# Patient Record
Sex: Female | Born: 1990 | Race: White | Hispanic: No | Marital: Single | Smoking: Never smoker
Health system: Southern US, Community
[De-identification: ages and names within clinical notes are randomized; demographics above are authoritative.]

## PROBLEM LIST (undated history)

## (undated) DIAGNOSIS — J45909 Unspecified asthma, uncomplicated: Secondary | ICD-10-CM

## (undated) DIAGNOSIS — E282 Polycystic ovarian syndrome: Secondary | ICD-10-CM

## (undated) HISTORY — PX: TONSILLECTOMY: SUR1361

---

## 2020-07-28 DIAGNOSIS — Z3480 Encounter for supervision of other normal pregnancy, unspecified trimester: Secondary | ICD-10-CM | POA: Insufficient documentation

## 2020-07-28 LAB — OB RESULTS CONSOLE HEPATITIS B SURFACE ANTIGEN: Hepatitis B Surface Ag: NEGATIVE

## 2020-07-28 LAB — OB RESULTS CONSOLE RPR: RPR: NONREACTIVE

## 2020-07-28 LAB — OB RESULTS CONSOLE RUBELLA ANTIBODY, IGM: Rubella: NON-IMMUNE/NOT IMMUNE

## 2020-07-28 LAB — OB RESULTS CONSOLE VARICELLA ZOSTER ANTIBODY, IGG: Varicella: IMMUNE

## 2020-07-28 LAB — OB RESULTS CONSOLE HIV ANTIBODY (ROUTINE TESTING): HIV: NONREACTIVE

## 2020-10-03 ENCOUNTER — Observation Stay
Admission: EM | Admit: 2020-10-03 | Discharge: 2020-10-03 | Disposition: A | Discharge status: Home / Self Care | Payer: BC Managed Care – PPO | Attending: Certified Nurse Midwife | Admitting: Certified Nurse Midwife

## 2020-10-03 ENCOUNTER — Other Ambulatory Visit: Payer: Self-pay

## 2020-10-03 ENCOUNTER — Encounter: Payer: Self-pay | Admitting: Obstetrics and Gynecology

## 2020-10-03 ENCOUNTER — Observation Stay: Payer: BC Managed Care – PPO

## 2020-10-03 DIAGNOSIS — O99212 Obesity complicating pregnancy, second trimester: Secondary | ICD-10-CM | POA: Diagnosis not present

## 2020-10-03 DIAGNOSIS — Z3A20 20 weeks gestation of pregnancy: Secondary | ICD-10-CM | POA: Diagnosis not present

## 2020-10-03 DIAGNOSIS — O26852 Spotting complicating pregnancy, second trimester: Principal | ICD-10-CM | POA: Insufficient documentation

## 2020-10-03 DIAGNOSIS — Z91040 Latex allergy status: Secondary | ICD-10-CM | POA: Insufficient documentation

## 2020-10-03 DIAGNOSIS — E669 Obesity, unspecified: Secondary | ICD-10-CM | POA: Insufficient documentation

## 2020-10-03 DIAGNOSIS — O4690 Antepartum hemorrhage, unspecified, unspecified trimester: Secondary | ICD-10-CM | POA: Diagnosis present

## 2020-10-03 HISTORY — DX: Polycystic ovarian syndrome: E28.2

## 2020-10-03 LAB — WET PREP, GENITAL
Clue Cells Wet Prep HPF POC: NONE SEEN
Sperm: NONE SEEN
Trich, Wet Prep: NONE SEEN
Yeast Wet Prep HPF POC: NONE SEEN

## 2020-10-03 MED ORDER — ACETAMINOPHEN 325 MG PO TABS: 650.0000 mg | ORAL_TABLET | ORAL | Status: DC | PRN

## 2020-10-03 NOTE — Discharge Summary (Signed)
Patient ID: Jacqueline French MRN: [id number] DOB/AGE: July 04, 1990 30 y.o.  Admit date: 10/03/2020 Discharge date: 10/03/2020  Admission Diagnoses: 30yo G2P0 at [redacted]w[redacted]d presents with cramping and pink bleeding with wiping starting around noon today, followed by several clots    Discharge Diagnoses: Spotting with reassuring u/s and labs and a resolution of bleeding  Factors complicating pregnancy: 1. Obesity - BMI 36 2. History of thyroid d/o 3. Anxiety d/o  4. Social concerns  5. Rubella non-immune  Prenatal Procedures: labs, ultrasound  Consults: None  Significant Diagnostic Studies:  Results for orders placed or performed during the hospital encounter of 10/03/20 (from the past 168 hour(s))  Wet prep, genital   Collection Time: 10/03/20  7:20 PM  Result Value Ref Range   Yeast Wet Prep HPF POC NONE SEEN NONE SEEN   Trich, Wet Prep NONE SEEN NONE SEEN   Clue Cells Wet Prep HPF POC NONE SEEN NONE SEEN   WBC, Wet Prep HPF POC FEW (A) NONE SEEN   Sperm NONE SEEN     Treatments: none  Hospital Course:  This is a 30 y.o. G2P0010 with IUP at [redacted]w[redacted]d seen for vaginal bleeding and cramping.    Wet prep - neg  U/s -  EXAM: LIMITED OBSTETRIC ULTRASOUND COMPARISON:  None. FINDINGS: Number of Fetuses: 1 Heart Rate:  141 bpm Movement: Yes Presentation: Variable Placental Location: Fundal posterior Previa: No Amniotic Fluid (Subjective):  Within normal limits. BPD: 4.4 cm 19 w  2 d MATERNAL FINDINGS: Cervix: There is a small amount of fluid within the internal cervical os. There is no funneling or bulging of membranes. Cervical length of 3.2 cm. Uterus/Adnexae: No abnormality visualized. IMPRESSION: 1. Negative for previa or retroplacental hematoma. 2. Small amount of fluid within the internal cervical os without apparent funneling or bulging of membranes, however short interval sonographic follow-up/reassessment is recommended  She was observed, fetal heart rate noted on doppler.   She was deemed stable for discharge to home with outpatient follow up.  Discharge Physical Exam:  BP (!) 119/55   Pulse 76   Temp 99 F (37.2 C) (Oral)   Resp 16   Ht 5\' 7"  (1.702 m)   Wt 106.6 kg   BMI 36.81 kg/m   General: NAD CV: RRR Pulm: CTABL, nl effort ABD: s/nd/nt, gravid DVT Evaluation: LE non-ttp, no evidence of DVT on exam.      Discharge Condition: Stable  Disposition: Discharge disposition: 01-Home or Self Care       Allergies as of 10/03/2020       Reactions   Sulfa Antibiotics Anaphylaxis   Latex Rash        Medication List     TAKE these medications    multivitamin-prenatal 27-0.8 MG Tabs tablet Take 1 tablet by mouth daily at 12 noon.         Signed11/06/2020, CNM 10/03/2020 8:47 PM

## 2020-10-03 NOTE — OB Triage Note (Signed)
Pt G2P0 at [redacted]w[redacted]d with c/o bleeding that started around noon. Pt reports pink bleeding when she wipes and several 1cm clots in the toilet. +FM. Pt reports 2/10 cramping in her right abd. VSS.

## 2020-10-03 NOTE — OB Triage Note (Signed)
Pt discharged home in stable condition. RN provided discharge instructions to pt, including information follow up care/when to come back. Pt verbalized understanding and all questions answered at this time.

## 2021-01-24 LAB — OB RESULTS CONSOLE GC/CHLAMYDIA
Chlamydia: NEGATIVE
Gonorrhea: NEGATIVE

## 2021-01-24 LAB — OB RESULTS CONSOLE GBS: GBS: POSITIVE

## 2021-01-24 NOTE — H&P (Signed)
Jacqueline French is a 30 y.o. female presenting for ECV Pt seen in office 01/24/21 by me and was counseled for ECV for breech presentation . Calculator 43%  OB History     Gravida  2   Para      Term      Preterm      AB  1   Living         SAB  1   IAB      Ectopic      Multiple      Live Births             Past Medical History:  Diagnosis Date   PCOS (polycystic ovarian syndrome)    Past Surgical History:  Procedure Laterality Date   TONSILLECTOMY     Family History: family history is not on file. Social History:  reports that she has never smoked. She has never used smokeless tobacco. She reports that she does not currently use alcohol. She reports that she does not use drugs.       Review of Systems Review of Systems: A full review of systems was performed and negative except as noted in the HPI.   Eyes: no vision change  Ears: left ear pain  Oropharynx: no sore throat  Pulmonary . No shortness of breath , no hemoptysis Cardiovascular: no chest pain , no irregular heart beat  Gastrointestinal:no blood in stool . No diarrhea, no constipation Uro gynecologic: no dysuria , no pelvic pain Neurologic : no seizure , no migraines    Musculoskeletal: no muscular weakness  History   Blood pressure (!) 119/55, pulse 76, temperature 99 F (37.2 C), temperature source Oral, resp. rate 16, height 5\' 7"  (1.702 m), weight 106.6 kg. Exam 118/74 weight 280 BMI 44 Physical Exam   Lungs CTA   CV rrr  Abd :  Prenatal labs: ABO, Rh:  O+  Assessment/Plan: Breech / back left  Counseled regarding the role of ECV . Risks of emergent c/s . Risks of unstable lie . Offered Primary LTCS . Pt opts for the former . Scheduled 12/26/20 at 1200   12/28/20 01/24/2021, 3:03 PM

## 2021-01-25 ENCOUNTER — Other Ambulatory Visit: Payer: Self-pay

## 2021-01-25 ENCOUNTER — Encounter: Payer: Self-pay | Admitting: Obstetrics and Gynecology

## 2021-01-25 ENCOUNTER — Observation Stay
Admission: EM | Admit: 2021-01-25 | Discharge: 2021-01-25 | Disposition: A | Discharge status: Home / Self Care | Payer: BC Managed Care – PPO | Attending: Obstetrics and Gynecology | Admitting: Obstetrics and Gynecology

## 2021-01-25 DIAGNOSIS — O321XX0 Maternal care for breech presentation, not applicable or unspecified: Principal | ICD-10-CM | POA: Insufficient documentation

## 2021-01-25 DIAGNOSIS — Z01818 Encounter for other preprocedural examination: Secondary | ICD-10-CM

## 2021-01-25 DIAGNOSIS — O320XX0 Maternal care for unstable lie, not applicable or unspecified: Secondary | ICD-10-CM | POA: Diagnosis present

## 2021-01-25 DIAGNOSIS — Z20822 Contact with and (suspected) exposure to covid-19: Secondary | ICD-10-CM | POA: Insufficient documentation

## 2021-01-25 HISTORY — DX: Unspecified asthma, uncomplicated: J45.909

## 2021-01-25 LAB — CBC
HCT: 41.3 % (ref 36.0–46.0)
Hemoglobin: 13.9 g/dL (ref 12.0–15.0)
MCH: 28.2 pg (ref 26.0–34.0)
MCHC: 33.7 g/dL (ref 30.0–36.0)
MCV: 83.8 fL (ref 80.0–100.0)
Platelets: 162 10*3/uL (ref 150–400)
RBC: 4.93 MIL/uL (ref 3.87–5.11)
RDW: 13.6 % (ref 11.5–15.5)
WBC: 10.1 10*3/uL (ref 4.0–10.5)
nRBC: 0 % (ref 0.0–0.2)

## 2021-01-25 LAB — RESP PANEL BY RT-PCR (FLU A&B, COVID) ARPGX2
Influenza A by PCR: NEGATIVE
Influenza B by PCR: NEGATIVE
SARS Coronavirus 2 by RT PCR: NEGATIVE

## 2021-01-25 MED ORDER — TERBUTALINE SULFATE 1 MG/ML IJ SOLN
0.2500 mg | Freq: Once | INTRAMUSCULAR | Status: DC
  Filled 2021-01-25: qty 1

## 2021-01-25 MED ORDER — TERBUTALINE SULFATE 1 MG/ML IJ SOLN
0.2500 mg | Freq: Once | INTRAMUSCULAR | Status: AC
  Administered 2021-01-25: 13:00:00 0.25 mg via SUBCUTANEOUS

## 2021-01-25 MED ORDER — LACTATED RINGERS IV SOLN: INTRAVENOUS | Status: DC

## 2021-01-25 NOTE — Progress Notes (Signed)
Patient ID: Jacqueline French, female   DOB: 02-04-1990, 30 y.o.   MRN: [id number] Pt here for ECV  NPO . IV placed   U/s confirms Breech spine right sided . Consent signed . Anesthesiology has been notified 1 dose Sq terbutaline given

## 2021-01-25 NOTE — OB Triage Note (Signed)
Discharge instructions reviewed and pt verbalized understanding. Follow up care reviewed and red flag labor precautions explained. Pt stable at the time of discharge home with mother.

## 2021-01-25 NOTE — Progress Notes (Signed)
Patient ID: Jacqueline French, female   DOB: [birth year], 30 y.o.   MRN: [id number] ECV attempted with CNM [place] . Unsuccessful after several attempts  EFM resumed fetal heart rate 120 Continue to monitor .  Set up for LTCS 02/09/21

## 2021-01-25 NOTE — Discharge Summary (Signed)
Unsucessful ECV   Reassuring NST before and after . No ctx post .  A; breech presentation , unsuccessful trial of ecv P; scheduled primary LTCs 02/09/21 Precautions until then

## 2021-01-25 NOTE — Op Note (Signed)
Jacqueline French, Jacqueline French MEDICAL RECORD NO: [id number] ACCOUNT NO: [id number] DATE OF BIRTH: 13-Mar-1990 FACILITY: [facility] LOCATION: [facility] PHYSICIAN: Suzy Bouchard, MD  Operative Report   DATE OF PROCEDURE: 01/25/2021  PREPROCEDURE DIAGNOSES: 1.  37+1 weeks estimated gestational age. 2.  Breech presentation.  POSTPROCEDURE DIAGNOSES: 1.  37+1 weeks. 2.  Breech presentation.  PROCEDURE:  Trial of external cephalic version.  SURGEON:  Suzy Bouchard, MD  FIRST ASSISTANT:  Haroldine Laws, certified nurse midwife.  INDICATIONS:  A 29 year old gravida 1, para 0, patient was noted to have her infant in the breech presentation the day before the procedure. The patient was counseled regarding the option for external cephalic version, which she has opted for.  DESCRIPTION OF PROCEDURE:  The patient was brought into the labor and delivery suite.  IV was placed.  Nonstress test was performed, which showed a reactive fetal monitoring.  The patient received 0.25 mg of subcutaneous terbutaline for uterine  relaxation.  An ultrasound revealed the fetal head to be in the right upper quadrant with the spine on the right side and infant was in the breech presentation.  Dr. Feliberto Gottron and Haroldine Laws then attempted the external cephalic version rolling the  infant in a clockwise position.  Several attempts were tried and ultrasound guidance showed that the head did move to the left upper quadrant, but did not proceed into the lower uterine segment.  The patient tolerated the procedure well.  Nonstress test  was then performed for 1 hour after with reassuring reactive nonstress test.  The patient tolerated the procedure well.  There was no bleeding, no contractions after the procedure.   VAI D: 01/25/2021 5:09:18 pm T: 01/25/2021 10:21:00 pm  JOB: [id number]/ [id number]

## 2021-01-25 NOTE — Progress Notes (Signed)
Pt arrived to L&D for scheduled external cephalic version. Pt stable at the time of arrival and VSS. Pt denies ctx, LOF, VB and confirms positive fetal movement. Pts mother at bedside for support. Schermerhorn MD made aware of pts arrival.

## 2021-01-29 NOTE — Discharge Summary (Signed)
Unsucessful ECV   Reassuring NST before and after . No ctx post .  A; breech presentation , unsuccessful trial of ecv P; scheduled primary LTCs 02/09/21 Precautions until then
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
## 2021-02-05 ENCOUNTER — Other Ambulatory Visit: Payer: BC Managed Care – PPO

## 2021-02-06 ENCOUNTER — Other Ambulatory Visit: Payer: Self-pay

## 2021-02-06 ENCOUNTER — Other Ambulatory Visit
Admission: RE | Admit: 2021-02-06 | Discharge: 2021-02-06 | Disposition: A | Discharge status: Home / Self Care | Payer: BC Managed Care – PPO | Source: Ambulatory Visit | Attending: Obstetrics and Gynecology | Admitting: Obstetrics and Gynecology

## 2021-02-06 DIAGNOSIS — Z20822 Contact with and (suspected) exposure to covid-19: Secondary | ICD-10-CM | POA: Insufficient documentation

## 2021-02-06 DIAGNOSIS — O99210 Obesity complicating pregnancy, unspecified trimester: Secondary | ICD-10-CM | POA: Insufficient documentation

## 2021-02-06 DIAGNOSIS — O320XX0 Maternal care for unstable lie, not applicable or unspecified: Secondary | ICD-10-CM

## 2021-02-06 DIAGNOSIS — Z01812 Encounter for preprocedural laboratory examination: Secondary | ICD-10-CM | POA: Insufficient documentation

## 2021-02-06 LAB — SARS CORONAVIRUS 2 (TAT 6-24 HRS): SARS Coronavirus 2: NEGATIVE
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
## 2021-02-07 ENCOUNTER — Inpatient Hospital Stay
Admission: EM | Admit: 2021-02-07 | Discharge: 2021-02-10 | DRG: 787 | Disposition: A | Discharge status: Home / Self Care | Payer: BC Managed Care – PPO | Attending: Obstetrics and Gynecology | Admitting: Obstetrics and Gynecology

## 2021-02-07 ENCOUNTER — Inpatient Hospital Stay: Payer: BC Managed Care – PPO | Admitting: Anesthesiology

## 2021-02-07 ENCOUNTER — Other Ambulatory Visit: Payer: Self-pay

## 2021-02-07 ENCOUNTER — Encounter: Payer: Self-pay | Admitting: Obstetrics and Gynecology

## 2021-02-07 ENCOUNTER — Other Ambulatory Visit: Payer: BC Managed Care – PPO

## 2021-02-07 DIAGNOSIS — Z9889 Other specified postprocedural states: Secondary | ICD-10-CM

## 2021-02-07 DIAGNOSIS — O99214 Obesity complicating childbirth: Secondary | ICD-10-CM | POA: Diagnosis present

## 2021-02-07 DIAGNOSIS — J45909 Unspecified asthma, uncomplicated: Secondary | ICD-10-CM | POA: Diagnosis present

## 2021-02-07 DIAGNOSIS — O99824 Streptococcus B carrier state complicating childbirth: Secondary | ICD-10-CM | POA: Diagnosis present

## 2021-02-07 DIAGNOSIS — O9081 Anemia of the puerperium: Secondary | ICD-10-CM | POA: Diagnosis not present

## 2021-02-07 DIAGNOSIS — O9952 Diseases of the respiratory system complicating childbirth: Secondary | ICD-10-CM | POA: Diagnosis present

## 2021-02-07 DIAGNOSIS — O320XX0 Maternal care for unstable lie, not applicable or unspecified: Principal | ICD-10-CM | POA: Diagnosis present

## 2021-02-07 DIAGNOSIS — D62 Acute posthemorrhagic anemia: Secondary | ICD-10-CM | POA: Diagnosis not present

## 2021-02-07 DIAGNOSIS — Z3A39 39 weeks gestation of pregnancy: Secondary | ICD-10-CM | POA: Diagnosis not present

## 2021-02-07 DIAGNOSIS — Z20822 Contact with and (suspected) exposure to covid-19: Secondary | ICD-10-CM | POA: Diagnosis present

## 2021-02-07 LAB — CBC
HCT: 37.1 % (ref 36.0–46.0)
Hemoglobin: 12.3 g/dL (ref 12.0–15.0)
MCH: 27.5 pg (ref 26.0–34.0)
MCHC: 33.2 g/dL (ref 30.0–36.0)
MCV: 83 fL (ref 80.0–100.0)
Platelets: 192 10*3/uL (ref 150–400)
RBC: 4.47 MIL/uL (ref 3.87–5.11)
RDW: 13.9 % (ref 11.5–15.5)
WBC: 10 10*3/uL (ref 4.0–10.5)
nRBC: 0 % (ref 0.0–0.2)

## 2021-02-07 LAB — ABO/RH: ABO/RH(D): O POS

## 2021-02-07 LAB — TYPE AND SCREEN
ABO/RH(D): O POS
Antibody Screen: NEGATIVE

## 2021-02-07 LAB — RPR: RPR Ser Ql: NONREACTIVE

## 2021-02-07 MED ORDER — SODIUM CHLORIDE 0.9 % IV SOLN
5.0000 10*6.[IU] | Freq: Once | INTRAVENOUS | Status: AC
  Administered 2021-02-07: 5 10*6.[IU] via INTRAVENOUS
  Filled 2021-02-07: qty 5

## 2021-02-07 MED ORDER — LACTATED RINGERS IV SOLN
INTRAVENOUS | Status: DC
  Administered 2021-02-07 (×2): 1000 mL via INTRAVENOUS

## 2021-02-07 MED ORDER — SODIUM CHLORIDE 0.9 % IV SOLN
INTRAVENOUS | Status: DC | PRN
  Administered 2021-02-07: 10 mL via EPIDURAL

## 2021-02-07 MED ORDER — MISOPROSTOL 25 MCG QUARTER TABLET
25.0000 ug | ORAL_TABLET | ORAL | Status: DC | PRN
  Administered 2021-02-07 (×2): 25 ug via BUCCAL
  Filled 2021-02-07 (×2): qty 1

## 2021-02-07 MED ORDER — ACETAMINOPHEN 500 MG PO TABS: 1000.0000 mg | ORAL_TABLET | Freq: Four times a day (QID) | ORAL | Status: DC | PRN

## 2021-02-07 MED ORDER — SODIUM CHLORIDE 0.9% FLUSH: 3.0000 mL | INTRAVENOUS | Status: DC | PRN

## 2021-02-07 MED ORDER — FENTANYL-BUPIVACAINE-NACL 0.5-0.125-0.9 MG/250ML-% EP SOLN
12.0000 mL/h | EPIDURAL | Status: DC | PRN
  Administered 2021-02-07: 12 mL/h via EPIDURAL

## 2021-02-07 MED ORDER — DIPHENHYDRAMINE HCL 50 MG/ML IJ SOLN: 12.5000 mg | INTRAMUSCULAR | Status: DC | PRN

## 2021-02-07 MED ORDER — LIDOCAINE-EPINEPHRINE (PF) 1.5 %-1:200000 IJ SOLN
INTRAMUSCULAR | Status: DC | PRN
  Administered 2021-02-07: 3 mL via EPIDURAL

## 2021-02-07 MED ORDER — OXYTOCIN-SODIUM CHLORIDE 30-0.9 UT/500ML-% IV SOLN
2.5000 [IU]/h | INTRAVENOUS | Status: DC
  Administered 2021-02-08: 30 [IU] via INTRAVENOUS
  Filled 2021-02-07: qty 500

## 2021-02-07 MED ORDER — PHENYLEPHRINE 40 MCG/ML (10ML) SYRINGE FOR IV PUSH (FOR BLOOD PRESSURE SUPPORT): 80.0000 ug | PREFILLED_SYRINGE | INTRAVENOUS | Status: DC | PRN

## 2021-02-07 MED ORDER — CALCIUM CARBONATE ANTACID 500 MG PO CHEW: 400.0000 mg | CHEWABLE_TABLET | Freq: Three times a day (TID) | ORAL | Status: DC | PRN

## 2021-02-07 MED ORDER — LACTATED RINGERS IV SOLN
500.0000 mL | INTRAVENOUS | Status: DC | PRN
  Administered 2021-02-07: 500 mL via INTRAVENOUS

## 2021-02-07 MED ORDER — LACTATED RINGERS IV SOLN
500.0000 mL | Freq: Once | INTRAVENOUS | Status: AC
  Administered 2021-02-07: 500 mL via INTRAVENOUS

## 2021-02-07 MED ORDER — PENICILLIN G POT IN DEXTROSE 60000 UNIT/ML IV SOLN
3.0000 10*6.[IU] | INTRAVENOUS | Status: DC
  Administered 2021-02-08 (×2): 3 10*6.[IU] via INTRAVENOUS
  Filled 2021-02-07 (×3): qty 50

## 2021-02-07 MED ORDER — SODIUM CHLORIDE 0.9 % IV SOLN: 250.0000 mL | INTRAVENOUS | Status: DC | PRN

## 2021-02-07 MED ORDER — SOD CITRATE-CITRIC ACID 500-334 MG/5ML PO SOLN: 30.0000 mL | ORAL | Status: DC | PRN

## 2021-02-07 MED ORDER — BUTORPHANOL TARTRATE 1 MG/ML IJ SOLN: 1.0000 mg | INTRAMUSCULAR | Status: DC | PRN

## 2021-02-07 MED ORDER — FENTANYL-BUPIVACAINE-NACL 0.5-0.125-0.9 MG/250ML-% EP SOLN
EPIDURAL | Status: AC
  Filled 2021-02-07: qty 250

## 2021-02-07 MED ORDER — TERBUTALINE SULFATE 1 MG/ML IJ SOLN
0.2500 mg | Freq: Once | INTRAMUSCULAR | Status: AC | PRN
  Administered 2021-02-08: 0.25 mg via SUBCUTANEOUS
  Filled 2021-02-07: qty 1

## 2021-02-07 MED ORDER — OXYTOCIN-SODIUM CHLORIDE 30-0.9 UT/500ML-% IV SOLN
1.0000 m[IU]/min | INTRAVENOUS | Status: DC
  Administered 2021-02-07: 2 m[IU]/min via INTRAVENOUS
  Administered 2021-02-07: 4 m[IU]/min via INTRAVENOUS
  Filled 2021-02-07 (×2): qty 500

## 2021-02-07 MED ORDER — MISOPROSTOL 200 MCG PO TABS
ORAL_TABLET | ORAL | Status: AC
  Filled 2021-02-07: qty 4

## 2021-02-07 MED ORDER — LIDOCAINE HCL (PF) 1 % IJ SOLN
INTRAMUSCULAR | Status: DC | PRN
  Administered 2021-02-07: 3 mL via SUBCUTANEOUS

## 2021-02-07 MED ORDER — LIDOCAINE HCL (PF) 1 % IJ SOLN
30.0000 mL | INTRAMUSCULAR | Status: DC | PRN
  Filled 2021-02-07: qty 30

## 2021-02-07 MED ORDER — OXYTOCIN BOLUS FROM INFUSION
333.0000 mL | Freq: Once | INTRAVENOUS | Status: DC
  Administered 2021-02-08: 333 mL via INTRAVENOUS

## 2021-02-07 MED ORDER — EPHEDRINE 5 MG/ML INJ: 10.0000 mg | INTRAVENOUS | Status: DC | PRN

## 2021-02-07 MED ORDER — ONDANSETRON HCL 4 MG/2ML IJ SOLN
4.0000 mg | Freq: Four times a day (QID) | INTRAMUSCULAR | Status: DC | PRN
  Administered 2021-02-08: 4 mg via INTRAVENOUS
  Filled 2021-02-07: qty 2

## 2021-02-07 MED ORDER — SODIUM CHLORIDE 0.9% FLUSH: 3.0000 mL | Freq: Two times a day (BID) | INTRAVENOUS | Status: DC

## 2021-02-07 MED ORDER — MISOPROSTOL 25 MCG QUARTER TABLET
25.0000 ug | ORAL_TABLET | ORAL | Status: DC | PRN
  Administered 2021-02-07 (×2): 25 ug via VAGINAL
  Filled 2021-02-07 (×2): qty 1

## 2021-02-07 NOTE — Anesthesia Procedure Notes (Signed)
Epidural Patient location during procedure: OB Start time: 02/07/2021 11:19 PM End time: 02/07/2021 11:44 PM  Staffing Anesthesiologist: Corinda Gubler, MD Performed: anesthesiologist   Preanesthetic Checklist Completed: patient identified, IV checked, site marked, risks and benefits discussed, surgical consent, monitors and equipment checked, pre-op evaluation and timeout performed  Epidural Patient position: sitting Prep: ChloraPrep Patient monitoring: heart rate, continuous pulse ox and blood pressure Approach: midline Location: L3-L4 Injection technique: LOR saline  Needle:  Needle type: Tuohy  Needle gauge: 17 G Needle length: 9 cm Needle insertion depth: 6 cm Catheter type: closed end flexible Catheter size: 19 Gauge Catheter at skin depth: 11 cm Test dose: negative and 1.5% lidocaine with Epi 1:200 K  Assessment Sensory level: T10 Events: blood not aspirated, injection not painful, no injection resistance, no paresthesia and negative IV test  Additional Notes first attempt Pt. Evaluated and documentation done after procedure finished. Patient identified. Risks/Benefits/Options discussed with patient including but not limited to bleeding, infection, nerve damage, paralysis, failed block, incomplete pain control, headache, blood pressure changes, nausea, vomiting, reactions to medication both or allergic, itching and postpartum back pain. Confirmed with bedside nurse the patient's most recent platelet count. Confirmed with patient that they are not currently taking any anticoagulation, have any bleeding history or any family history of bleeding disorders. Patient expressed understanding and wished to proceed. All questions were answered. Sterile technique was used throughout the entire procedure. Please see nursing notes for vital signs. Test dose was given through epidural catheter and negative prior to continuing to dose epidural or start infusion. Warning signs of high block  given to the patient including shortness of breath, tingling/numbness in hands, complete motor block, or any concerning symptoms with instructions to call for help. Patient was given instructions on fall risk and not to get out of bed. All questions and concerns addressed with instructions to call with any issues or inadequate analgesia.     Patient tolerated the insertion well without immediate complications.  Reason for block: procedure for painReason for block:procedure for pain

## 2021-02-07 NOTE — Progress Notes (Signed)
Labor Progress Note  Jacqueline French is a 31 y.o. G2P0010 at [redacted]w[redacted]d by LMP admitted for induction of labor due to unstable lie.  Subjective: Having the breath through her UCs  Objective: BP 127/83 (BP Location: Left Arm)    Pulse 75    Temp 98.6 F (37 C) (Oral)    Resp 16    Ht 5\' 7"  (1.702 m)    Wt 130 kg    SpO2 96%    BMI 44.89 kg/m   Fetal Assessment: FHT:  FHR: 125 bpm, variability: moderate,  accelerations:  Present,  decelerations:  Present occasional decels   Category/reactivity:  Category I UC:   regular, q 2-4 min SVE:    Dilation: FT  Effacement: 50%  Station:  -3  Consistency: medium  Position: middle  Membrane status:Intact Amniotic color: n/a  Labs: Lab Results  Component Value Date   WBC 10.0 02/07/2021   HGB 12.3 02/07/2021   HCT 37.1 02/07/2021   MCV 83.0 02/07/2021   PLT 192 02/07/2021    Assessment / Plan: Induction of labor due to unstable lie 0109 Cytotec 25mg  Buccal and 25mg  Vaginal  0500 Cytotec 25mg  Buccal and 25mg  Vaginal  1057 Pitocin initiated  1925 Pitocin halved to 54mU 2120 Plan to do low dose pitocin through the night - 67mU  Labor:  Low dose pitocin for cervical ripening Preeclampsia:   127/83 Fetal Wellbeing:  Category I Pain Control:  Labor support without medications I/D:  GBS pos - will start abx with labor, Afebrile, Intact Anticipated MOD:  NSVD  , CNM 02/07/2021, 9:46 PM

## 2021-02-07 NOTE — Progress Notes (Signed)
Labor Progress Note  Jacqueline French is a 31 y.o. G2P0010 at [redacted]w[redacted]d by LMP admitted for induction of labor due to unstable lie.  Subjective: Pt is comfortable.  Reporting more cramping.  Objective: BP 118/65    Pulse 76    Temp 97.7 F (36.5 C) (Oral)    Resp 16    Ht 5\' 7"  (1.702 m)    Wt 130 kg    SpO2 96%    BMI 44.89 kg/m   Fetal Assessment: FHT:  FHR: 130 bpm, variability: moderate,  accelerations:  Present,  decelerations:  Present occasional decels  with resolution with interventions - position changes Category/reactivity:  Category I UC:   regular, q 3-4 min SVE:    Dilation: FT  Effacement: 50%  Station:  -3  Consistency: medium  Position: middle  Membrane status:Intact Amniotic color: n/a  Labs: Lab Results  Component Value Date   WBC 10.0 02/07/2021   HGB 12.3 02/07/2021   HCT 37.1 02/07/2021   MCV 83.0 02/07/2021   PLT 192 02/07/2021    Assessment / Plan: Induction of labor due to unstable lie 0109 Cytotec 25mg  Buccal and 25mg  Vaginal  0500 Cytotec 25mg  Buccal and 25mg  Vaginal  1057 Pitocin initiated - currently at 60mU  Labor: Progressing on Pitocin, will continue to increase then AROM Preeclampsia:   118/65 Fetal Wellbeing:  Category I Pain Control:  Labor support without medications I/D:  GBS pos - will start abx with labor, Afebrile, Intact Anticipated MOD:  NSVD  , CNM 02/07/2021, 3:14 PM
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
## 2021-02-07 NOTE — Anesthesia Preprocedure Evaluation (Signed)
Anesthesia Evaluation  Patient identified by MRN, date of birth, ID band Patient awake    Reviewed: Allergy & Precautions, NPO status , Patient's Chart, lab work & pertinent test results  History of Anesthesia Complications Negative for: history of anesthetic complications  Airway Mallampati: III  TM Distance: >3 FB Neck ROM: Full    Dental no notable dental hx. (+) Teeth Intact   Pulmonary asthma , neg sleep apnea, neg COPD, Patient abstained from smoking.Not current smoker,  Well controlled asthma, no routine inhaler use, no hospitalizations since 31 years old.   Pulmonary exam normal breath sounds clear to auscultation       Cardiovascular Exercise Tolerance: Good METS(-) hypertension(-) CAD and (-) Past MI negative cardio ROS  (-) dysrhythmias  Rhythm:Regular Rate:Normal - Systolic murmurs    Neuro/Psych negative neurological ROS  negative psych ROS   GI/Hepatic neg GERD  ,(+)     (-) substance abuse  ,   Endo/Other  neg diabetesMorbid obesity  Renal/GU negative Renal ROS     Musculoskeletal   Abdominal   Peds  Hematology   Anesthesia Other Findings Past Medical History: No date: Asthma No date: PCOS (polycystic ovarian syndrome)  Reproductive/Obstetrics (+) Pregnancy                             Anesthesia Physical Anesthesia Plan  ASA: 3  Anesthesia Plan: Epidural   Post-op Pain Management:    Induction:   PONV Risk Score and Plan: 2 and Treatment may vary due to age or medical condition and Ondansetron  Airway Management Planned: Natural Airway  Additional Equipment:   Intra-op Plan:   Post-operative Plan:   Informed Consent: I have reviewed the patients History and Physical, chart, labs and discussed the procedure including the risks, benefits and alternatives for the proposed anesthesia with the patient or authorized representative who has indicated his/her  understanding and acceptance.       Plan Discussed with: Surgeon  Anesthesia Plan Comments: (Discussed R/B/A of neuraxial anesthesia technique with patient: - rare risks of spinal/epidural hematoma, nerve damage, infection - Risk of PDPH - Risk of itching - Risk of nausea and vomiting - Risk of poor block necessitating replacement of epidural. - Risk of allergic reactions. Patient voiced understanding.)        Anesthesia Quick Evaluation

## 2021-02-07 NOTE — Progress Notes (Signed)
Labor Progress Note  Jacqueline French is a 31 y.o. G2P0010 at [redacted]w[redacted]d by LMP admitted for induction of labor due to unstable lie.  Subjective: Pt is comfortable.  Discussed FHT and POC with Pt and her mother.   Objective: BP 130/71    Pulse 68    Temp 98.3 F (36.8 C) (Oral)    Resp 16    Ht 5\' 7"  (1.702 m)    Wt 130 kg    SpO2 96%    BMI 44.89 kg/m   Fetal Assessment: FHT:  FHR: 130 bpm, variability: moderate,  accelerations:  Present,  decelerations:  Present occasional decels  starting around 0500 with resolution with interventions Category/reactivity:  Category I UC:   irregular SVE:    Dilation: FT  Effacement: 50%  Station:  -3  Consistency: medium  Position: middle  Membrane status:Intact Amniotic color: n/a  Labs: Lab Results  Component Value Date   WBC 10.0 02/07/2021   HGB 12.3 02/07/2021   HCT 37.1 02/07/2021   MCV 83.0 02/07/2021   PLT 192 02/07/2021    Assessment / Plan: Induction of labor due to unstable lie 0109 Cytotec 25mg  Buccal and 25mg  Vaginal  0500 Cytotec 25mg  Buccal and 25mg  Vaginal   Labor:  Labor net yet started, unable to repeat a dose of Cytotec due to FHT Will plan to start Pitocin Preeclampsia:   130/71 Fetal Wellbeing:  Category I with moderate variability and occasional decels Pain Control:  Labor support without medications I/D:   GBS pos - will start abx with labor, Afebrile, Intact Anticipated MOD:  NSVD  04/07/2021, CNM 02/07/2021, 9:21 AM

## 2021-02-08 ENCOUNTER — Encounter
Admission: EM | Disposition: A | Discharge status: Home / Self Care | Payer: Self-pay | Source: Home / Self Care | Attending: Obstetrics and Gynecology

## 2021-02-08 ENCOUNTER — Encounter: Payer: Self-pay | Admitting: Obstetrics and Gynecology

## 2021-02-08 DIAGNOSIS — Z9889 Other specified postprocedural states: Secondary | ICD-10-CM

## 2021-02-08 LAB — CBC
HCT: 35.4 % — ABNORMAL LOW (ref 36.0–46.0)
Hemoglobin: 11.9 g/dL — ABNORMAL LOW (ref 12.0–15.0)
MCH: 28.1 pg (ref 26.0–34.0)
MCHC: 33.6 g/dL (ref 30.0–36.0)
MCV: 83.7 fL (ref 80.0–100.0)
Platelets: 163 10*3/uL (ref 150–400)
RBC: 4.23 MIL/uL (ref 3.87–5.11)
RDW: 13.7 % (ref 11.5–15.5)
WBC: 16.1 10*3/uL — ABNORMAL HIGH (ref 4.0–10.5)
nRBC: 0 % (ref 0.0–0.2)

## 2021-02-08 LAB — CREATININE, SERUM
Creatinine, Ser: 0.67 mg/dL (ref 0.44–1.00)
GFR, Estimated: >60 mL/min (ref >60)

## 2021-02-08 SURGERY — Surgical Case
Anesthesia: Epidural

## 2021-02-08 MED ORDER — SIMETHICONE 80 MG PO CHEW
80.0000 mg | CHEWABLE_TABLET | Freq: Three times a day (TID) | ORAL | Status: DC
  Administered 2021-02-08 – 2021-02-10 (×5): 80 mg via ORAL
  Filled 2021-02-08 (×5): qty 1

## 2021-02-08 MED ORDER — CEFAZOLIN SODIUM-DEXTROSE 2-4 GM/100ML-% IV SOLN
INTRAVENOUS | Status: AC
  Filled 2021-02-08: qty 100

## 2021-02-08 MED ORDER — KETOROLAC TROMETHAMINE 30 MG/ML IJ SOLN
30.0000 mg | Freq: Four times a day (QID) | INTRAMUSCULAR | Status: DC
  Administered 2021-02-08 (×2): 30 mg via INTRAVENOUS
  Filled 2021-02-08 (×3): qty 1

## 2021-02-08 MED ORDER — CEFAZOLIN SODIUM 1 G IJ SOLR
INTRAMUSCULAR | Status: AC
  Filled 2021-02-08: qty 10

## 2021-02-08 MED ORDER — OXYTOCIN-SODIUM CHLORIDE 30-0.9 UT/500ML-% IV SOLN
2.5000 [IU]/h | INTRAVENOUS | Status: AC
  Administered 2021-02-08: 2.5 [IU]/h via INTRAVENOUS

## 2021-02-08 MED ORDER — ZOLPIDEM TARTRATE 5 MG PO TABS: 5.0000 mg | ORAL_TABLET | Freq: Every evening | ORAL | Status: DC | PRN

## 2021-02-08 MED ORDER — ENOXAPARIN SODIUM 40 MG/0.4ML IJ SOSY
40.0000 mg | PREFILLED_SYRINGE | INTRAMUSCULAR | Status: DC
  Administered 2021-02-09 – 2021-02-10 (×2): 40 mg via SUBCUTANEOUS
  Filled 2021-02-08 (×2): qty 0.4

## 2021-02-08 MED ORDER — MENTHOL 3 MG MT LOZG
1.0000 | LOZENGE | OROMUCOSAL | Status: DC | PRN
  Filled 2021-02-08: qty 9

## 2021-02-08 MED ORDER — COCONUT OIL OIL
1.0000 "application " | TOPICAL_OIL | Status: DC | PRN
  Filled 2021-02-08: qty 120

## 2021-02-08 MED ORDER — TETANUS-DIPHTH-ACELL PERTUSSIS 5-2.5-18.5 LF-MCG/0.5 IM SUSY
0.5000 mL | PREFILLED_SYRINGE | Freq: Once | INTRAMUSCULAR | Status: AC
  Filled 2021-02-08: qty 0.5

## 2021-02-08 MED ORDER — IBUPROFEN 600 MG PO TABS: 600.0000 mg | ORAL_TABLET | Freq: Four times a day (QID) | ORAL | Status: DC

## 2021-02-08 MED ORDER — LIDOCAINE HCL (PF) 2 % IJ SOLN
INTRAMUSCULAR | Status: DC | PRN
  Administered 2021-02-08 (×2): 100 mg via EPIDURAL
  Administered 2021-02-08: 300 mg via EPIDURAL

## 2021-02-08 MED ORDER — DIPHENHYDRAMINE HCL 25 MG PO CAPS: 25.0000 mg | ORAL_CAPSULE | Freq: Four times a day (QID) | ORAL | Status: DC | PRN

## 2021-02-08 MED ORDER — MORPHINE SULFATE (PF) 0.5 MG/ML IJ SOLN
INTRAMUSCULAR | Status: AC
  Filled 2021-02-08: qty 10

## 2021-02-08 MED ORDER — WITCH HAZEL-GLYCERIN EX PADS: 1.0000 "application " | MEDICATED_PAD | CUTANEOUS | Status: DC | PRN

## 2021-02-08 MED ORDER — SODIUM CHLORIDE (PF) 0.9 % IJ SOLN
INTRAMUSCULAR | Status: AC
  Filled 2021-02-08: qty 50

## 2021-02-08 MED ORDER — ACETAMINOPHEN 500 MG PO TABS
1000.0000 mg | ORAL_TABLET | Freq: Four times a day (QID) | ORAL | Status: DC
  Administered 2021-02-08 – 2021-02-10 (×7): 1000 mg via ORAL
  Filled 2021-02-08 (×7): qty 2

## 2021-02-08 MED ORDER — SIMETHICONE 80 MG PO CHEW
80.0000 mg | CHEWABLE_TABLET | ORAL | Status: DC | PRN
  Administered 2021-02-09: 80 mg via ORAL
  Filled 2021-02-08: qty 1

## 2021-02-08 MED ORDER — OXYCODONE HCL 5 MG PO TABS
5.0000 mg | ORAL_TABLET | ORAL | Status: DC | PRN
  Administered 2021-02-09 (×3): 5 mg via ORAL
  Filled 2021-02-08 (×3): qty 1

## 2021-02-08 MED ORDER — BUPIVACAINE HCL (PF) 0.25 % IJ SOLN
INTRAMUSCULAR | Status: DC | PRN
  Administered 2021-02-08: 10 mL

## 2021-02-08 MED ORDER — MORPHINE SULFATE (PF) 0.5 MG/ML IJ SOLN
INTRAMUSCULAR | Status: DC | PRN
  Administered 2021-02-08: 3 mg via EPIDURAL

## 2021-02-08 MED ORDER — SENNOSIDES-DOCUSATE SODIUM 8.6-50 MG PO TABS
2.0000 | ORAL_TABLET | Freq: Every day | ORAL | Status: DC
  Administered 2021-02-09 – 2021-02-10 (×2): 2 via ORAL
  Filled 2021-02-08 (×2): qty 2

## 2021-02-08 MED ORDER — DIBUCAINE (PERIANAL) 1 % EX OINT: 1.0000 "application " | TOPICAL_OINTMENT | CUTANEOUS | Status: DC | PRN

## 2021-02-08 MED ORDER — MEASLES, MUMPS & RUBELLA VAC IJ SOLR
0.5000 mL | Freq: Once | INTRAMUSCULAR | Status: DC
  Filled 2021-02-08: qty 0.5

## 2021-02-08 MED ORDER — FENTANYL CITRATE (PF) 100 MCG/2ML IJ SOLN
INTRAMUSCULAR | Status: AC
  Filled 2021-02-08: qty 2

## 2021-02-08 MED ORDER — MORPHINE SULFATE (PF) 2 MG/ML IV SOLN: 1.0000 mg | INTRAVENOUS | Status: DC | PRN

## 2021-02-08 MED ORDER — KETOROLAC TROMETHAMINE 30 MG/ML IJ SOLN
INTRAMUSCULAR | Status: DC | PRN
  Administered 2021-02-08: 30 mg via INTRAVENOUS

## 2021-02-08 MED ORDER — ALBUTEROL SULFATE HFA 108 (90 BASE) MCG/ACT IN AERS
INHALATION_SPRAY | RESPIRATORY_TRACT | Status: AC
  Filled 2021-02-08: qty 6.7

## 2021-02-08 MED ORDER — KETOROLAC TROMETHAMINE 30 MG/ML IJ SOLN
INTRAMUSCULAR | Status: AC
  Filled 2021-02-08: qty 1

## 2021-02-08 MED ORDER — BUPIVACAINE HCL (PF) 0.5 % IJ SOLN
INTRAMUSCULAR | Status: AC
  Filled 2021-02-08: qty 30

## 2021-02-08 MED ORDER — PHENYLEPHRINE HCL-NACL 20-0.9 MG/250ML-% IV SOLN
INTRAVENOUS | Status: DC | PRN
  Administered 2021-02-08: 50 ug/min via INTRAVENOUS

## 2021-02-08 MED ORDER — CEFAZOLIN SODIUM-DEXTROSE 1-4 GM/50ML-% IV SOLN
INTRAVENOUS | Status: DC | PRN
  Administered 2021-02-08: 1 g via INTRAVENOUS

## 2021-02-08 MED ORDER — CEFAZOLIN SODIUM-DEXTROSE 2-3 GM-%(50ML) IV SOLR
INTRAVENOUS | Status: DC | PRN
  Administered 2021-02-08: 2 g via INTRAVENOUS

## 2021-02-08 MED ORDER — PRENATAL MULTIVITAMIN CH
1.0000 | ORAL_TABLET | Freq: Every day | ORAL | Status: DC
  Administered 2021-02-08 – 2021-02-09 (×2): 1 via ORAL
  Filled 2021-02-08 (×2): qty 1

## 2021-02-08 MED ORDER — BUPIVACAINE LIPOSOME 1.3 % IJ SUSP
INTRAMUSCULAR | Status: AC
  Filled 2021-02-08: qty 20

## 2021-02-08 SURGICAL SUPPLY — 29 items
CHLORAPREP W/TINT 26 (MISCELLANEOUS) ×2 IMPLANT
DRSG TELFA 3X8 NADH (GAUZE/BANDAGES/DRESSINGS) ×2 IMPLANT
ELECT REM PT RETURN 9FT ADLT (ELECTROSURGICAL) ×2
ELECTRODE REM PT RTRN 9FT ADLT (ELECTROSURGICAL) ×1 IMPLANT
GAUZE SPONGE 4X4 12PLY STRL (GAUZE/BANDAGES/DRESSINGS) ×2 IMPLANT
GOWN STRL REUS W/ TWL LRG LVL3 (GOWN DISPOSABLE) ×3 IMPLANT
GOWN STRL REUS W/TWL LRG LVL3 (GOWN DISPOSABLE) ×3
MANIFOLD NEPTUNE II (INSTRUMENTS) ×2 IMPLANT
MAT PREVALON FULL STRYKER (MISCELLANEOUS) ×2 IMPLANT
NDL HYPO 25GX1X1/2 BEV (NEEDLE) ×1 IMPLANT
NEEDLE HYPO 25GX1X1/2 BEV (NEEDLE) ×2 IMPLANT
NS IRRIG 1000ML POUR BTL (IV SOLUTION) ×2 IMPLANT
PACK C SECTION AR (MISCELLANEOUS) ×2 IMPLANT
PAD DRESSING TELFA 3X8 NADH (GAUZE/BANDAGES/DRESSINGS) ×1 IMPLANT
PAD OB MATERNITY 4.3X12.25 (PERSONAL CARE ITEMS) ×2 IMPLANT
PAD PREP 24X41 OB/GYN DISP (PERSONAL CARE ITEMS) ×2 IMPLANT
PENCIL SMOKE EVACUATOR (MISCELLANEOUS) ×2 IMPLANT
SCRUB EXIDINE 4% CHG 4OZ (MISCELLANEOUS) ×2 IMPLANT
SUT CHROMIC 1 CTX 36 (SUTURE) ×3 IMPLANT
SUT MNCRL 4-0 (SUTURE) ×1
SUT MNCRL 4-0 27XMFL (SUTURE) ×1
SUT VIC AB 0 CT1 36 (SUTURE) ×4 IMPLANT
SUT VIC AB 0 CTX 36 (SUTURE) ×2
SUT VIC AB 0 CTX36XBRD ANBCTRL (SUTURE) ×2 IMPLANT
SUT VIC AB 2-0 SH 27 (SUTURE) ×3
SUT VIC AB 2-0 SH 27XBRD (SUTURE) ×2 IMPLANT
SUTURE MNCRL 4-0 27XMF (SUTURE) ×1 IMPLANT
SYR 30ML LL (SYRINGE) ×4 IMPLANT
WATER STERILE IRR 500ML POUR (IV SOLUTION) ×2 IMPLANT

## 2021-02-08 NOTE — Progress Notes (Signed)
Labor Progress Note  Jacqueline French is a 31 y.o. G2P0010 at [redacted]w[redacted]d by LMP admitted for induction of labor due to unstable lie.  Subjective: Called for recurrent decels then quickly called back to come to [facility] for prolong decel  Objective: BP 107/80 (BP Location: Right Arm)    Pulse 86    Temp 98.8 F (37.1 C) (Oral)    Resp 19    Ht 5\' 7"  (1.702 m)    Wt 130 kg    SpO2 98%    BMI 44.89 kg/m   Fetal Assessment: At 0620 3 recurrent decels likely late in nature with mod variability down to 60-80s, an accel noted a few minutes before.  Intrauterine resuscitation started with position change, and fluid bolus. At 0630 heart rate went down to 40 and stayed down and stat c/s called UC:   regular, every 2-4 minutes SVE:    Dilation: 3cm  Effacement: 70%  Station:  -2  Consistency: ---  Position: ----  Membrane status: SROM'd at 2220 Amniotic color: Clear  Labs: Lab Results  Component Value Date   WBC 10.0 02/07/2021   HGB 12.3 02/07/2021   HCT 37.1 02/07/2021   MCV 83.0 02/07/2021   PLT 192 02/07/2021    Assessment / Plan: Induction of labor due to unstable lie 0109 Cytotec 25mg  Buccal and 25mg  Vaginal  0500 Cytotec 25mg  Buccal and 25mg  Vaginal  1057 Pitocin initiated  1925 Pitocin halved to 85mU 2120 Plan to do low dose pitocin through the night - 12mU 2220 SROM 2319 Epidural placed 0030 FSE placed by RN 0130 IUPC placed and plan to increase pitocin by 62mU 0423 Pitocin turned off for FHT   Labor: Induction stopped and stat c/s called Pain Control:  Epidural Anticipated MOD:  C/s  [place], CNM 02/08/2021, 8:40 AM

## 2021-02-08 NOTE — Progress Notes (Signed)
Emergency call to [phone]

## 2021-02-08 NOTE — Brief Op Note (Signed)
02/07/2021 - 02/08/2021  7:25 AM  PATIENT:  Jacqueline French  31 y.o. female  PRE-OPERATIVE DIAGNOSIS:  emergency Cesarean delivery  Fetal bradycardia POST-OPERATIVE DIAGNOSIS:  emergency Cesarean delivery, see delivery summary  PROCEDURE:  Procedure(s): CESAREAN SECTION LTCS- emergent  SURGEON:  Surgeon(s) and Role:    *     * Caidan Hubbert, Ihor Austin, MD  PHYSICIAN ASSISTANT: Haroldine Laws , CNM   ASSISTANTS: Christeen Douglas , MD   ANESTHESIA:   epidural  EBL:  qbl : 370 cc IOF 500 cc   BLOOD ADMINISTERED:none  DRAINS: Urinary Catheter (Foley)   LOCAL MEDICATIONS USED:  MARCAINE     SPECIMEN:  No Specimen  DISPOSITION OF SPECIMEN:  N/A  COUNTS:  YES  TOURNIQUET:  * No tourniquets in log *  DICTATION: .Other Dictation: Dictation Number verbal  PLAN OF CARE: Admit to inpatient   PATIENT DISPOSITION:  PACU - hemodynamically stable.   Delay start of Pharmacological VTE agent (>24hrs) due to surgical blood loss or risk of bleeding: not applicable

## 2021-02-08 NOTE — Transfer of Care (Signed)
Immediate Anesthesia Transfer of Care Note  Patient: Jacqueline French  Procedure(s) Performed: CESAREAN SECTION  Patient Location: PACU and Mother/Baby  Anesthesia Type:Epidural  Level of Consciousness: awake and patient cooperative  Airway & Oxygen Therapy: Patient Spontanous Breathing  Post-op Assessment: Report given to RN and Post -op Vital signs reviewed and stable  Post vital signs: Reviewed and stable  Last Vitals:  Vitals Value Taken Time  BP 110/66 0740 02/08/21  Temp    Pulse 78   Resp 18   SpO2 98     Last Pain:  Vitals:   02/08/21 0415  TempSrc: Oral  PainSc:       Patients Stated Pain Goal: 0 (02/08/21 0200)  Complications: No notable events documented.

## 2021-02-08 NOTE — Progress Notes (Signed)
Postpartum Day  0  Subjective: no complaints  Doing well, no concerns. Ambulating without difficulty, pain managed with PO meds, tolerating regular diet, and voiding without difficulty.   No fever/chills, chest pain, shortness of breath, nausea/vomiting, or leg pain. No nipple or breast pain. No headache, visual changes, or RUQ/epigastric pain.  Objective: BP (!) 118/53 (BP Location: Right Arm)    Pulse 68    Temp 97.9 F (36.6 C) (Oral)    Resp 20    Ht 5\' 7"  (1.702 m)    Wt 130 kg    SpO2 96%    BMI 44.89 kg/m    Physical Exam:  General: alert, cooperative, and appears stated age Breasts: soft/nontender CV: RRR Pulm: nl effort, CTABL Abdomen: soft, non-tender, active bowel sounds Uterine Fundus: firm Incision: no significant drainage   Recent Labs    02/07/21 0100 02/08/21 1032  HGB 12.3 11.9*  HCT 37.1 35.4*  WBC 10.0 16.1*  PLT 192 163    Assessment/Plan: 31 y.o. G2P0010 postpartum day # 0  -Continue routine postpartum care -Lactation consult PRN for breastfeeding  -Acute blood loss anemia - hemodynamically stable and asymptomatic; start PO ferrous sulfate BID with stool softeners  -Immunization status:   declines all vaccines, Rubella non-immune   Disposition: Continue inpatient postpartum care  ----- Avelino Leeds Certified Nurse Midwife [facility]

## 2021-02-08 NOTE — Op Note (Signed)
French, STAPLES MEDICAL RECORD NO: [id number] ACCOUNT NO: [id number] DATE OF BIRTH: 20-Aug-1990 FACILITY: [facility] LOCATION: [facility] PHYSICIAN: Suzy Bouchard, MD  Operative Report   DATE OF PROCEDURE: 02/08/2021  PREOPERATIVE DIAGNOSES: 1.  39+1 weeks estimated gestational age. 2.  Fetal bradycardia.  POSTOPERATIVE DIAGNOSES: 1.  39+1 weeks estimated gestational age. 2.  Fetal bradycardia. 3.  Vigorous female delivered.  PROCEDURE:  Emergent primary low transverse cesarean section.    ANESTHESIA:  Surgical dosing of continuous lumbar epidural.  SURGEON:  Suzy Bouchard, MD.  FIRST ASSISTANT:  Haroldine Laws.  SECOND ASSISTANT: Christeen Douglas, MD  INDICATIONS:  39+1 weeks estimated gestational age, was brought in on the day previous for fetal unstable lie.  The patient underwent an induction of labor and spontaneously delivered. During labor, fetal heart rate decelerated to 30-60 beats per minute  for 13 minutes and an emergent cesarean section was called for.    From notification of emergent cesarean section to delivery of the baby, 18 minutes.  DESCRIPTION OF PROCEDURE:  After adequate surgical dosing of a continuous lumbar epidural, the patient was prepped and draped.  Surgeon emergently presented and timeout was performed.  A Pfannenstiel incision was made 2 fingerbreadths above the symphysis  pubis.  Sharp dissection was used to identify the fascia.  Fascia was scored in the midline and opened in transverse fashion.  The superior aspect of the fascia was grasped with Kocher clamps.  Recti muscles were dissected free.  Entry into the  peritoneal cavity was accomplished bluntly.  A direct low transverse uterine incision was made upon entry into the endometrial cavity, which was extremely attenuated. Fetal head was then brought to the incision and a Kiwi vacuum was applied to the  occiput and a large wedged head was brought through the incision.  A vacuum was  removed and the shoulders and body were delivered without difficulty, a vigorous female, crying on the abdomen.  Cord was doubly clamped and female infant was passed to nursery  staff who assigned Apgar scores of 9 and 9, fetal weight 2650 grams.  Time of birth 33 hours.  Time of notification for emergent cesarean section [phone].  The placenta was manually delivered and the uterus was exteriorized and wiped clean with laparotomy  tape.  Uterine incision was then closed with 1 chromic suture in a running locking fashion.  Good approximation of edges.  Good hemostasis noted.  Two additional figure-of-eight sutures were required for hemostasis.  Fallopian tubes and ovaries appeared  normal.  The posterior cul-de-sac was irrigated and suctioned and the uterus was placed back in the abdominal cavity.  The pericolic gutters were wiped clean with laparotomy tape and uterine incision again appeared hemostatic.  Interceed was placed over  the uterine incision in T shape fashion.  The fascia was then closed with 0 Vicryl suture in a running nonlocking fashion.  Good approximation of edges.  Fascial edges were injected with a solution of 60 mL Marcaine plus 20 mL normal saline,  approximately 40 mL of the solution was injected.  Subcutaneous tissues were irrigated and bovied and given the depth of the subcutaneous tissues of 5 cm, subcutaneous tissues were closed with a 2-0 chromic suture.  The skin was reapproximated with  Insorb absorbable staples.  Good cosmetic effect.  There were no complications.  The patient tolerated the procedure well.    QUANTITATIVE BLOOD LOSS: 370 mL  INTRAOPERATIVE FLUIDS:  500 mL  URINE OUTPUT:  150 mL  The patient was taken to recovery room in good condition.  Of note, the patient did receive 3 grams of IV Ancef for surgical prophylaxis.     PAA D: 02/08/2021 7:47:29 am T: 02/08/2021 9:14:00 am  JOB: [id number]/ [id number]

## 2021-02-08 NOTE — Lactation Note (Signed)
This note was copied from a baby's chart. Lactation Consultation Note  Patient Name: Boy Alanie Syler [id number] Date: 02/08/2021 Reason for consult: Follow-up assessment;Primapara;Term;Infant < 6lbs Age:28 hours  Lactation follow-up. Baby's second glucose check was 40 pre feeding. [facility] student assisted with putting baby to breast with and without the nipple shield; baby did better this feeding without the shield. Baby was on/off the breast a few times, overall feeding for 10 minutes. LC taught hand expression and expressed 35mL and spoon fed to baby after breast feeding; baby tolerated this well. Post feed check to be at 12:20 (RN updated) Discussed with mom: -Attempted feedings every 2-3 hours, 3 hrs at the latest -Encouraged skin to skin with baby 30 minutes prior to feeding attempt -Allow baby to be at breast as long as possible while active -Hand express/spoon feed post feeding Mom and support person in room [facility]) verbalize understanding of plan.  Maternal Data Has patient been taught Hand Expression?: Yes Does the patient have breastfeeding experience prior to this delivery?: No  Feeding Mother's Current Feeding Choice: Breast Milk  LATCH Score Latch: Repeated attempts needed to sustain latch, nipple held in mouth throughout feeding, stimulation needed to elicit sucking reflex.  Audible Swallowing: A few with stimulation  Type of Nipple: Flat  Comfort (Breast/Nipple): Soft / non-tender  Hold (Positioning): Assistance needed to correctly position infant at breast and maintain latch.  LATCH Score: 6   Lactation Tools Discussed/Used Tools:  (did not use shield this time) Nipple shield size: 20  Interventions Interventions: Breast feeding basics reviewed;Assisted with latch;Skin to skin;Hand express;Adjust position;Support pillows;Education  Discharge Pump: Personal (hands free pump)  Consult Status Consult Status: Follow-up Date: 02/08/21 Follow-up type:  In-patient    [name] 02/08/2021, 11:26 AM

## 2021-02-08 NOTE — Lactation Note (Signed)
This note was copied from a baby's chart. Lactation Consultation Note  Patient Name: Jacqueline French [id number] Date: 02/08/2021 Reason for consult: L&D Initial assessment;RN request;Primapara;Term;Infant < 6lbs;Other (Comment) (c-section) Age:29 hours  Initial lactation visit in [place], requested by transition for assistance. P1 mom delivered via STAT c-section 2 hours ago. Mom is awake and alert and pediatrician has been in to do her exam.  Transition RN assisted with first feeding post delivery while still in OR, mom is reported to have flat nipples that are hard for baby to sustain latch. LC provided a nipple shield; baby latches easily and sustains latch with shield, 5-43min feeding with occasional swallows.  Provided brief education on feeding expectations, frequent attempts, positioning and latch.   Maternal Data Has patient been taught Hand Expression?: Yes Does the patient have breastfeeding experience prior to this delivery?: No  Feeding Mother's Current Feeding Choice: Breast Milk  LATCH Score Latch: Grasps breast easily, tongue down, lips flanged, rhythmical sucking. (with nipple shield)  Audible Swallowing: A few with stimulation  Type of Nipple: Flat  Comfort (Breast/Nipple): Soft / non-tender  Hold (Positioning): Assistance needed to correctly position infant at breast and maintain latch.  LATCH Score: 7   Lactation Tools Discussed/Used Tools: Nipple Shields Nipple shield size: 20  Interventions Interventions: Assisted with latch;Hand express;Adjust position;Support pillows;Education;Breast feeding basics reviewed (nipple shield)  Discharge    Consult Status Consult Status: Follow-up from L&D    Lavonia Drafts 02/08/2021, 9:39 AM

## 2021-02-08 NOTE — Lactation Note (Signed)
This note was copied from a baby's chart. Lactation Consultation Note  Patient Name: Jacqueline French [id number] Date: 02/08/2021 Reason for consult: Follow-up assessment;RN request (low sugar) Age:29 hours  Lactation requested in patients room for low sugar (37) 1hr post feeding. Mom was already attempting to get baby latched at the breast but baby was very sleepy and reluctant to open/sustain latch and breastfeed effectively. LC and mom discussed supplement options per low sugar protocols; mom opts for donor breastmilk and signed consent. LC student was able to also hand express 43mL of colostrum into spoon- it was fed to baby and he tolerated it well.  Donor breastmilk was thawed out, and 34mL warmed. LC discussed paced bottle feeding technique and mom requested demonstration for first feeding. LC fed baby 68mL DBM via bottle w/ slow flow nipple over 5-7 minutes, demonstrating paced bottle feeding technique. Baby tolerated feeding well and burped throughout feeding w/ breaks. Feeding ended at 1311; RN notified for 1hr post feed glucose check. LC explained next steps depending on outcome of check in 1 hour.   Maternal Data Has patient been taught Hand Expression?: Yes Does the patient have breastfeeding experience prior to this delivery?: No  Feeding Mother's Current Feeding Choice: Breast Milk Nipple Type: Slow - flow  LATCH Score Latch: Repeated attempts needed to sustain latch, nipple held in mouth throughout feeding, stimulation needed to elicit sucking reflex.  Audible Swallowing: None  Type of Nipple: Flat  Comfort (Breast/Nipple): Soft / non-tender  Hold (Positioning): No assistance needed to correctly position infant at breast.  LATCH Score: 6   Lactation Tools Discussed/Used Tools: Bottle  Interventions Interventions: Breast feeding basics reviewed;Hand express;Education (spoon fed)  Discharge Pump: Personal (hands free pump)  Consult Status Consult Status:  Follow-up Date: 02/08/21 Follow-up type: In-patient    [name] 02/08/2021, 1:17 PM

## 2021-02-08 NOTE — Progress Notes (Signed)
Labor Progress Note  Jacqueline French is a 31 y.o. G2P0010 at [redacted]w[redacted]d by LMP admitted for induction of labor due to unstable lie.  Subjective: More comfortable with epidural and sleeping  Objective: BP (!) 101/57    Pulse (!) 54    Temp 98 F (36.7 C) (Oral)    Resp 16    Ht 5\' 7"  (1.702 m)    Wt 130 kg    SpO2 98%    BMI 44.89 kg/m   Fetal Assessment: FHT:  FHR: 130 bpm, variability: moderate,  accelerations:  Present,  decelerations:  Absent  Category/reactivity:  Category I UC:  unable to trace well SVE:    Dilation: 3cm  Effacement: 50%  Station:  -3  Consistency: medium  Position: middle  Membrane status: SROM'd at 2220 Amniotic color: clear  Labs: Lab Results  Component Value Date   WBC 10.0 02/07/2021   HGB 12.3 02/07/2021   HCT 37.1 02/07/2021   MCV 83.0 02/07/2021   PLT 192 02/07/2021    Assessment / Plan: Induction of labor due to unstable lie 0109 Cytotec 25mg  Buccal and 25mg  Vaginal  0500 Cytotec 25mg  Buccal and 25mg  Vaginal  1057 Pitocin initiated  1925 Pitocin halved to 52mU 2120 Plan to do low dose pitocin through the night - 61mU 2220 SROM 2319 Epidural placed 0030 FSE placed by RN 0130 IUPC placed and plan to increase pitocin by 23mU  Labor: Progressing normally Preeclampsia:   96/48 Fetal Wellbeing:  Category I Pain Control:  Epidural I/D:  GBS pos - 1st dose abx at 2230, Afebrile, SROM'd x 3hr Anticipated MOD:  NSVD  2121, CNM 02/08/2021, 1:27 AM
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
## 2021-02-08 NOTE — Progress Notes (Signed)
Labor Progress Note  Jacqueline French is a 31 y.o. G2P0010 at [redacted]w[redacted]d by LMP admitted for induction of labor due to unstable lie.  Subjective: At [phone] RN called requesting a review of the strip   Objective: BP (!) 106/58    Pulse 72    Temp 98.8 F (37.1 C) (Oral)    Resp 16    Ht 5\' 7"  (1.702 m)    Wt 130 kg    SpO2 97%    BMI 44.89 kg/m   Fetal Assessment: At 0420 3 decels noted, likely late decels, RNs discontinued pitocin at the time of decels, changed maternal position and stated a fluid bolus - FHT quickly resolved to FHT:  FHR: 130 bpm, variability: moderate,  accelerations:  Present,  decelerations:  Present see above Category/reactivity:  Category I UC:   regular, every 2-4 minutes SVE:    Dilation: 3cm  Effacement: 70%  Station:  -2  Consistency: ---  Position: ----  Membrane status: SROM'd at 2220 Amniotic color: Clear  Labs: Lab Results  Component Value Date   WBC 10.0 02/07/2021   HGB 12.3 02/07/2021   HCT 37.1 02/07/2021   MCV 83.0 02/07/2021   PLT 192 02/07/2021    Assessment / Plan: Induction of labor due to unstable lie 0109 Cytotec 25mg  Buccal and 25mg  Vaginal  0500 Cytotec 25mg  Buccal and 25mg  Vaginal  1057 Pitocin initiated  1925 Pitocin halved to 55mU 2120 Plan to do low dose pitocin through the night - 21mU 2220 SROM 2319 Epidural placed 0030 FSE placed by RN 0130 IUPC placed and plan to increase pitocin by 73mU 0423 Pitocin turned off for FHT   Labor: Difficulty continuing with induction d/t FHTs Preeclampsia:   128/64 Fetal Wellbeing:  Category I Pain Control:  Epidural I/D:  GBS pos - 2 doses completed, Afebrile, SROM'd x 7hr Anticipated MOD:  NSVD  , CNM 02/08/2021, 7:44 AM

## 2021-02-09 ENCOUNTER — Encounter: Admission: EM | Disposition: A | Discharge status: Home / Self Care | Payer: Self-pay | Source: Home / Self Care

## 2021-02-09 ENCOUNTER — Inpatient Hospital Stay: Admit: 2021-02-09 | Payer: BC Managed Care – PPO | Admitting: Obstetrics and Gynecology

## 2021-02-09 DIAGNOSIS — Z01818 Encounter for other preprocedural examination: Secondary | ICD-10-CM

## 2021-02-09 LAB — CBC
HCT: 32.6 % — ABNORMAL LOW (ref 36.0–46.0)
Hemoglobin: 10.9 g/dL — ABNORMAL LOW (ref 12.0–15.0)
MCH: 27.9 pg (ref 26.0–34.0)
MCHC: 33.4 g/dL (ref 30.0–36.0)
MCV: 83.6 fL (ref 80.0–100.0)
Platelets: 153 10*3/uL (ref 150–400)
RBC: 3.9 MIL/uL (ref 3.87–5.11)
RDW: 13.8 % (ref 11.5–15.5)
WBC: 10 10*3/uL (ref 4.0–10.5)
nRBC: 0 % (ref 0.0–0.2)

## 2021-02-09 SURGERY — Surgical Case
Anesthesia: Spinal

## 2021-02-09 MED ORDER — HYDROCORTISONE 1 % EX OINT
TOPICAL_OINTMENT | Freq: Two times a day (BID) | CUTANEOUS | Status: DC
  Filled 2021-02-09: qty 28.35

## 2021-02-09 MED ORDER — IBUPROFEN 600 MG PO TABS
600.0000 mg | ORAL_TABLET | Freq: Four times a day (QID) | ORAL | Status: DC
  Administered 2021-02-09 – 2021-02-10 (×6): 600 mg via ORAL
  Filled 2021-02-09 (×6): qty 1

## 2021-02-09 NOTE — Progress Notes (Signed)
Post Partum Day 1  Subjective: Doing well, no complaints.  Tolerating regular diet, pain with PO meds, voiding and ambulating without difficulty.  No CP SOB Fever,Chills, N/V or leg pain; denies nipple or breast pain, no HA change of vision, RUQ/epigastric pain.  States abdominal irritation where pressure dressing was in place.   Objective: BP (!) 111/58 (BP Location: Right Arm)    Pulse 76    Temp 98 F (36.7 C)    Resp 20    Ht '5\' 7"'  (1.702 m)    Wt 130 kg    SpO2 99%    BMI 44.89 kg/m    Physical Exam:  General: NAD Breasts: soft/nontender, no nipple abrasions/excoriation/bruising, no engorgement, warm to touch, filling/firm CV: RRR Pulm: nl effort, CTABL Abdomen: soft, NT, BS x 4. Erythema from tape dressing on left abdomen.  Incision: intact/no erythema or drainage Perineum: no edema, intact Lochia: moderate Uterine Fundus: fundus firm  DVT Evaluation: no cords, ttp LEs   Recent Labs    02/08/21 1032 02/09/21 0619  HGB 11.9* 10.9*  HCT 35.4* 32.6*  WBC 16.1* 10.0  PLT 163 153    Assessment/Plan: 31 y.o. G2P0010 postpartum day # 1  - Continue routine PP care - RN instructed to apply honeycomb dressing to incision site - Hydrocortisone cream ordered BID for affected areas on abdomen - Lactation consult, cluster feeding overnight - Discussed contraceptive options including implant, IUDs hormonal and non-hormonal, injection, pills/ring/patch, condoms, and NFP. Planning on using condoms. - Acute blood loss anemia - hemodynamically stable and asymptomatic - Immunization status: Needs MMR prior to DC   Disposition: Does not desire Dc home today.   Pt seen and evaluated by Nada Boozer SNM, ECU with direct supervision of CNM.   Francetta Found, CNM 02/09/2021  8:49 AM

## 2021-02-09 NOTE — Anesthesia Postprocedure Evaluation (Signed)
Anesthesia Post Note  Patient: Jacqueline French  Procedure(s) Performed: CESAREAN SECTION  Patient location during evaluation: Mother Baby Anesthesia Type: Epidural Level of consciousness: oriented and awake and alert Pain management: pain level controlled Vital Signs Assessment: post-procedure vital signs reviewed and stable Respiratory status: spontaneous breathing and respiratory function stable Cardiovascular status: blood pressure returned to baseline and stable Postop Assessment: no headache, no backache, no apparent nausea or vomiting and able to ambulate Anesthetic complications: no   No notable events documented.   Last Vitals:  Vitals:   02/09/21 0700 02/09/21 0829  BP:  (!) 111/58  Pulse: 86 76  Resp:  20  Temp:  36.7 C  SpO2: 96% 99%    Last Pain:  Vitals:   02/09/21 0715  TempSrc:   PainSc: 0-No pain                 Reed Breech

## 2021-02-09 NOTE — Lactation Note (Addendum)
This note was copied from a baby's chart. Lactation Consultation Note  Patient Name: Jacqueline French [id number] Date: 02/09/2021 Reason for consult: Follow-up assessment;Primapara;Term;Infant < 6lbs Age:29 hours  Maternal Data Has patient been taught Hand Expression?: Yes Does the patient have breastfeeding experience prior to this delivery?: No  Feeding Mother's Current Feeding Choice: Breast Milk Still sl sleepy after circ but no feeding since circ, eager and rooting once awakened, attempted in left football hold but baby kept letting breast fall out of mouth, placed in cradle hold on left, latches easily with sandwiching breast tissue but frequently lets latch go and lets breast fall from mouth, mom shown how to support breast after baby latched and how to get deeper latch, nursed 25 min with swallows noted on left, offered right breast in cradle hold, latched easily with shaping of breast but lets breast come out of mouth if not supported well, mom shown how to get deeper latch and support breast to assist baby with maintaining latch as he tires easily with wt being less than 6 lbs. Mom has bruise on left areola      LATCH Score Latch: Grasps breast easily, tongue down, lips flanged, rhythmical sucking. (pulls away frequently)  Audible Swallowing: A few with stimulation  Type of Nipple: Everted at rest and after stimulation  Comfort (Breast/Nipple): Filling, red/small blisters or bruises, mild/mod discomfort  Hold (Positioning): Assistance needed to correctly position infant at breast and maintain latch.  LATCH Score: 7   Lactation Tools Discussed/Used  LC name and no written on white board  Interventions Interventions: Breast feeding basics reviewed;Assisted with latch;Skin to skin;Hand express;Breast compression;Adjust position;Support pillows;Position options;Expressed milk;Coconut oil;Education, purelan samples given with instruction in use  Discharge Pump: Personal WIC  Program: No  Consult Status Consult Status: PRN Date: 02/09/21 Follow-up type: In-patient    Dyann Kief 02/09/2021, 12:56 PM

## 2021-02-10 MED ORDER — PRENATAL MULTIVITAMIN CH
1.0000 | ORAL_TABLET | Freq: Every day | ORAL | Status: AC
Start: 2021-02-10 — End: ?

## 2021-02-10 MED ORDER — FERROUS SULFATE 325 (65 FE) MG PO TABS: 325.0000 mg | ORAL_TABLET | Freq: Two times a day (BID) | ORAL | 3 refills | Status: AC

## 2021-02-10 MED ORDER — IBUPROFEN 600 MG PO TABS
600.0000 mg | ORAL_TABLET | Freq: Four times a day (QID) | ORAL | 0 refills | Status: AC
Start: 2021-02-10 — End: ?

## 2021-02-10 MED ORDER — ACETAMINOPHEN 500 MG PO TABS
1000.0000 mg | ORAL_TABLET | Freq: Four times a day (QID) | ORAL | 0 refills | Status: AC
Start: 2021-02-10 — End: ?

## 2021-02-10 MED ORDER — OXYCODONE HCL 5 MG PO TABS: 5.0000 mg | ORAL_TABLET | ORAL | 0 refills | Status: AC | PRN

## 2021-02-10 MED ORDER — WITCH HAZEL-GLYCERIN EX PADS: 1.0000 "application " | MEDICATED_PAD | CUTANEOUS | 12 refills | Status: AC | PRN
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
## 2021-02-10 NOTE — Progress Notes (Signed)
Patient discharged with infant. Discharge instructions, prescriptions, and follow up appointments given to and reviewed with patient. Patient verbalized understanding. Will be escorted out by axillary.  °

## 2021-02-16 ENCOUNTER — Ambulatory Visit: Payer: Self-pay

## 2021-02-16 NOTE — Lactation Note (Signed)
This note was copied from a baby's chart. Lactation Consultation Note  Patient Name: Jacqueline Arnt Carapia-Pitts Jr. Today's Date: 02/16/2021 Reason for consult: Initial assessment;Term;Hyperbilirubinemia;1st time breastfeeding;Primapara Age:29 days   LC Note:  Baby admitted for hyperbilirubinemia; now 64 days old.  Mother's current feeding preference is breast only.  Baby's bilirubin level is 19.4 mg/dl.    Attempted to visit with mother, however, she was sleeping.  Awakened her due to baby also sleeping on her chest and discussed safe sleep.  Mother was not interested in placing baby back in the bassinet.  She will call for assistance as needed.  Follow up visit later today.    Maternal Data    Feeding Mother's Current Feeding Choice: Breast Milk  LATCH Score                    Lactation Tools Discussed/Used    Interventions    Discharge    Consult Status Consult Status: Follow-up Date: 02/16/21 Follow-up type: In-patient    Fabiola Mudgett R Joshua Soulier 02/16/2021, 6:05 AM

## 2021-02-16 NOTE — Lactation Note (Signed)
This note was copied from a baby's chart. Lactation Consultation Note  Patient Name: Jacqueline Arnt Mitch-Pitts Jr. Today's Date: 02/16/2021 Reason for consult: Follow-up assessment;1st time breastfeeding;Primapara;Term;Infant < 6lbs;Infant weight loss;Hyperbilirubinemia Age:29 days  LC in to visit with P1 Mom of term baby that was readmitted for hyperbilirubinemia (serum bili was 22.3)  Baby has since had a bilirubin level of 18.4 at 0751 today.  Mom is not under phototherapy.  Baby's weight is at 5.4% weight loss and continuing to lose weight on day 8.  Stools green/yellow per Mom.  As entered room, noted Mom with baby at the breast in cradle hold.  Mom holding baby up with baby's body in her lap.  Breast looked compressed and baby was fidgety trying to establish a deep latch.  Offered to help with a more comfortable position.  Mom very receptive to [facility] help.    Placed pillow in lap, head of bed up for support.  Assisted Mom in using cross cradle hold.  Baby's latch looked wide and deep and Mom denied any pain.  Showed Mom how to compress breast and identify deep jaw extensions with pauses. More swallows identified. Baby vigorously sucking and swallowing for about 10 mins before he became sleepy.  Talked about normal sleepiness with newborn jaundice.  After breastfeeding for a total of 15 mins, baby's diaper changed and baby was cueing vigorously.  Mom has the hand's free pump in her bra on second breast.  Expressed 35 ml.  Assisted FOB to pace bottle feed baby.  Baby leaking a bit of milk around his mouth initially.  Burping done and then baby settled into a consistent suck/swallow with pauses.  Baby took 30 ml in 5 mins.  Lots of teaching done on paced bottle feeding and burping frequently.  LC assisted Mom to double pump on maintenance setting.  Mom to pump 15-20 mins after baby breastfeeds.  Mom to save milk in refrigerator, but keep 30 ml out to supplement baby after feeding.  Plan  recommended_ 1-STS as much as possible 2- Offer breast every 3 hrs or sooner if baby is cueing, using pillow support to align baby's body, making sure latch is deep. 3- After baby becomes sleepy on the breast (10-15 min), Mom will supplement with 30 ml EBM increasing volume as baby desires. 4-Pump both breasts after breastfeeding 15-20 mins.  Parents instructed on how take care of pump parts. 2 bins provided for washing and drying.  Mom encouraged to ask for help prn.   Feeding Mother's Current Feeding Choice: Breast Milk Nipple Type: Slow - flow  LATCH Score Latch: Grasps breast easily, tongue down, lips flanged, rhythmical sucking.  Audible Swallowing: Spontaneous and intermittent  Type of Nipple: Everted at rest and after stimulation  Comfort (Breast/Nipple): Soft / non-tender  Hold (Positioning): Assistance needed to correctly position infant at breast and maintain latch.  LATCH Score: 9   Lactation Tools Discussed/Used Tools: Pump;Flanges;Bottle Flange Size: 24 Breast pump type: Double-Electric Breast Pump Pump Education: Setup, frequency, and cleaning;Milk Storage Reason for Pumping: Support milk supply Pumping frequency: Encouraged Mom to pump after breastfeeding 15-20 mins Pumped volume: 35 mL (from one breast using hand's free pump during feeding)  Interventions Interventions: Breast feeding basics reviewed;Assisted with latch;Skin to skin;Breast compression;Adjust position;Support pillows;Position options;Expressed milk;DEBP;LC Services brochure  Discharge Pump: Personal (hand's free pump and a DEBP at home (unsure of brand)) WIC Program: No  Consult Status Consult Status: Follow-up Date: 02/17/21 Follow-up type: In-patient    Jacqueline French,  Jacqueline French 02/16/2021, 10:39 AM

## 2021-03-28 ENCOUNTER — Other Ambulatory Visit: Payer: Self-pay | Admitting: Obstetrics and Gynecology

## 2021-03-28 DIAGNOSIS — Z803 Family history of malignant neoplasm of breast: Secondary | ICD-10-CM

## 2021-03-28 DIAGNOSIS — N6002 Solitary cyst of left breast: Secondary | ICD-10-CM

## 2021-04-02 ENCOUNTER — Inpatient Hospital Stay: Admission: RE | Admit: 2021-04-02 | Payer: BC Managed Care – PPO | Source: Ambulatory Visit

## 2021-04-09 ENCOUNTER — Other Ambulatory Visit: Payer: Self-pay

## 2021-04-09 ENCOUNTER — Ambulatory Visit
Admission: RE | Admit: 2021-04-09 | Discharge: 2021-04-09 | Disposition: A | Discharge status: Home / Self Care | Payer: BC Managed Care – PPO | Source: Ambulatory Visit | Attending: Obstetrics and Gynecology | Admitting: Obstetrics and Gynecology

## 2021-04-09 DIAGNOSIS — Z803 Family history of malignant neoplasm of breast: Secondary | ICD-10-CM | POA: Diagnosis present

## 2021-04-09 DIAGNOSIS — N6002 Solitary cyst of left breast: Secondary | ICD-10-CM | POA: Diagnosis present

## 2021-06-03 ENCOUNTER — Encounter: Payer: Self-pay | Admitting: Obstetrics and Gynecology

## 2021-09-16 ENCOUNTER — Other Ambulatory Visit: Payer: Self-pay

## 2021-09-16 ENCOUNTER — Emergency Department: Payer: BC Managed Care – PPO

## 2021-09-16 DIAGNOSIS — W010XXA Fall on same level from slipping, tripping and stumbling without subsequent striking against object, initial encounter: Secondary | ICD-10-CM | POA: Insufficient documentation

## 2021-09-16 DIAGNOSIS — J45909 Unspecified asthma, uncomplicated: Secondary | ICD-10-CM | POA: Insufficient documentation

## 2021-09-16 DIAGNOSIS — S0993XA Unspecified injury of face, initial encounter: Secondary | ICD-10-CM | POA: Diagnosis not present

## 2021-09-16 NOTE — ED Triage Notes (Signed)
Ambulatory to triage with c/o injury to left side of face. Pt states she was slipped on wet floor and attempted to catch herself on the door handle of the refrigerator, but the entire refrigerator fell onto the left side of her face.  Pt presents with edema and bruising to left face and orbital area. Pt reports pain when trying to open left eye and had small amount of blood from nose when accident happened  Denies LOC, denies use of blood thinners.

## 2021-09-17 ENCOUNTER — Emergency Department
Admission: EM | Admit: 2021-09-17 | Discharge: 2021-09-17 | Disposition: A | Discharge status: Home / Self Care | Payer: BC Managed Care – PPO | Attending: Emergency Medicine | Admitting: Emergency Medicine

## 2021-09-17 DIAGNOSIS — S0993XA Unspecified injury of face, initial encounter: Secondary | ICD-10-CM

## 2021-09-17 MED ORDER — IBUPROFEN 400 MG PO TABS
400.0000 mg | ORAL_TABLET | Freq: Once | ORAL | Status: AC
  Administered 2021-09-17: 400 mg via ORAL
  Filled 2021-09-17: qty 1

## 2021-09-17 NOTE — ED Provider Notes (Signed)
[facility] Provider Note    Event Date/Time   First MD Initiated Contact with Patient 09/17/21 0154     (approximate)   History   Facial Injury   HPI  Jacqueline French is a 31 y.o. female past medical history of asthma and PCOS presents with injury to left face.  Patient slipped on the floor today attempted to catch herself on the hand of the refrigerator and accidentally pulled the refrigerator down onto her.  She fell to the floor and the fridge landed on top of her.  She thinks her glasses hit her on the left side of the face.  She complains of pain on the left cheek region denies visual change.  Denies headache neck pain or chest pain has been ambulating denies other extremity pain.  She is not anticoagulated.   Past Medical History:  Diagnosis Date   Asthma    PCOS (polycystic ovarian syndrome)     Patient Active Problem List   Diagnosis Date Noted   Postoperative state 02/08/2021   Indication for care in labor or delivery 02/07/2021   Obesity affecting pregnancy 02/06/2021   Unstable lie of fetus 01/25/2021   Supervision of other normal pregnancy, antepartum 07/28/2020     Physical Exam  Triage Vital Signs: ED Triage Vitals  Enc Vitals Group     BP 09/16/21 2300 94/60     Pulse Rate 09/16/21 2300 (!) 109     Resp 09/16/21 2300 19     Temp 09/16/21 2300 98.4 F (36.9 C)     Temp Source 09/16/21 2300 Oral     SpO2 09/16/21 2300 97 %     Weight 09/16/21 2301 285 lb (129.3 kg)     Height 09/16/21 2301 5\' 7"  (1.702 m)     Head Circumference --      Peak Flow --      Pain Score 09/16/21 2301 6     Pain Loc --      Pain Edu? --      Excl. in GC? --     Most recent vital signs: Vitals:   09/16/21 2300 09/17/21 0225  BP: 94/60 127/83  Pulse: (!) 109 90  Resp: 19 16  Temp: 98.4 F (36.9 C)   SpO2: 97%      General: Awake, no distress.  CV:  Good peripheral perfusion.  Resp:  Normal effort.  Abd:  No distention.  Neuro:              Awake, Alert, Oriented x 3  Other:  Ecchymosis over the left cheek and left infraorbital region with mild swelling PERRLA, extraocular movements are intact, no conjunctival injection No other signs of trauma to the midface or mandible No C-spine tenderness  No chest wall tenderness Abdomen is nontender   ED Results / Procedures / Treatments  Labs (all labs ordered are listed, but only abnormal results are displayed) Labs Reviewed - No data to display   EKG     RADIOLOGY I reviewed and interpreted the CT scan of the brain which does not show any acute intracranial process    PROCEDURES:  Critical Care performed: No  Procedures   MEDICATIONS ORDERED IN ED: Medications  ibuprofen (ADVIL) tablet 400 mg (400 mg Oral Given 09/17/21 0219)     IMPRESSION / MDM / ASSESSMENT AND PLAN / ED COURSE  I reviewed the triage vital signs and the nursing notes.  Patient's presentation is most consistent with acute presentation with potential threat to life or bodily function.  Differential diagnosis includes, but is not limited to, orbital fracture, contusion, intracranial hemorrhage, concussion  The patient is a 31 year old female presents with a facial injury.  After slipping and holding onto the refrigerator leaving the refrigerator fell on top of her.  Her really only complaint is pain on the left side of her face.  She has ecchymosis in the left infraorbital and cheek region but extraocular movements intact globe itself looks normal she has no visual changes.  No other signs of trauma to the head or neck there is no tenderness in the chest wall or abdomen or pelvis she has no other extremity symptoms.  CT head C-spine and max face were obtained were negative for fracture or other injuries.  Recommended icing to reduce swelling and NSAIDs.  She is appropriate for discharge at this time.       FINAL CLINICAL IMPRESSION(S) / ED DIAGNOSES   Final  diagnoses:  Facial injury, initial encounter     Rx / DC Orders   ED Discharge Orders     None        Note:  This document was prepared using Dragon voice recognition software and may include unintentional dictation errors.   Georga Hacking, MD 09/17/21 [phone]

## 2021-09-17 NOTE — Discharge Instructions (Signed)
Your CAT scans of her head neck and face did not show any broken bones or injuries to your brain.  You have soft tissue swelling from the injury.  You can take ibuprofen and Tylenol for pain and ice the area to prevent swelling.

## 2022-02-11 ENCOUNTER — Emergency Department (HOSPITAL_COMMUNITY): Payer: BC Managed Care – PPO

## 2022-02-11 ENCOUNTER — Other Ambulatory Visit: Payer: Self-pay

## 2022-02-11 ENCOUNTER — Emergency Department (HOSPITAL_COMMUNITY)
Admission: EM | Admit: 2022-02-11 | Discharge: 2022-02-11 | Disposition: A | Discharge status: Home / Self Care | Payer: BC Managed Care – PPO | Attending: Emergency Medicine | Admitting: Emergency Medicine

## 2022-02-11 ENCOUNTER — Encounter (HOSPITAL_COMMUNITY): Payer: Self-pay

## 2022-02-11 DIAGNOSIS — M25531 Pain in right wrist: Secondary | ICD-10-CM | POA: Diagnosis not present

## 2022-02-11 DIAGNOSIS — R519 Headache, unspecified: Secondary | ICD-10-CM | POA: Diagnosis not present

## 2022-02-11 DIAGNOSIS — Z91040 Latex allergy status: Secondary | ICD-10-CM | POA: Insufficient documentation

## 2022-02-11 DIAGNOSIS — M791 Myalgia, unspecified site: Secondary | ICD-10-CM | POA: Insufficient documentation

## 2022-02-11 DIAGNOSIS — S0083XA Contusion of other part of head, initial encounter: Secondary | ICD-10-CM | POA: Insufficient documentation

## 2022-02-11 DIAGNOSIS — S0993XA Unspecified injury of face, initial encounter: Secondary | ICD-10-CM | POA: Diagnosis present

## 2022-02-11 MED ORDER — HYDROCODONE-ACETAMINOPHEN 5-325 MG PO TABS
1.0000 | ORAL_TABLET | Freq: Once | ORAL | Status: AC
  Administered 2022-02-11: 1 via ORAL
  Filled 2022-02-11: qty 1
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
## 2022-02-11 NOTE — ED Notes (Signed)
Dc instructions reviewed with pt. PT verbalized understanding. PT Dc with Sheriff.
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
## 2022-02-11 NOTE — Discharge Instructions (Signed)
CT scan of the brain did not show signs of skull fracture or brain bleeding.  X-rays of the right wrist do not show a fracture of the wrist at this time.  You can take 600 mg ibuprofen and 650 mg tylenol (over the counter) every 6 hours for the next 7 days, as needed, for pain.

## 2023-02-04 IMAGING — US US BREAST*L* LIMITED INC AXILLA
1 series · 3 of 3 positions shown · non-contrast
Comparison: None.

CLINICAL DATA: 30-year-old female with a physician palpated left
breast lump. Patient is currently breast feeding.

EXAM:
ULTRASOUND OF THE LEFT BREAST

[Series 1: us breast*left* limited inc axilla · 0.08mm/px · 3 of 3 slices shown]
[im 1/3]
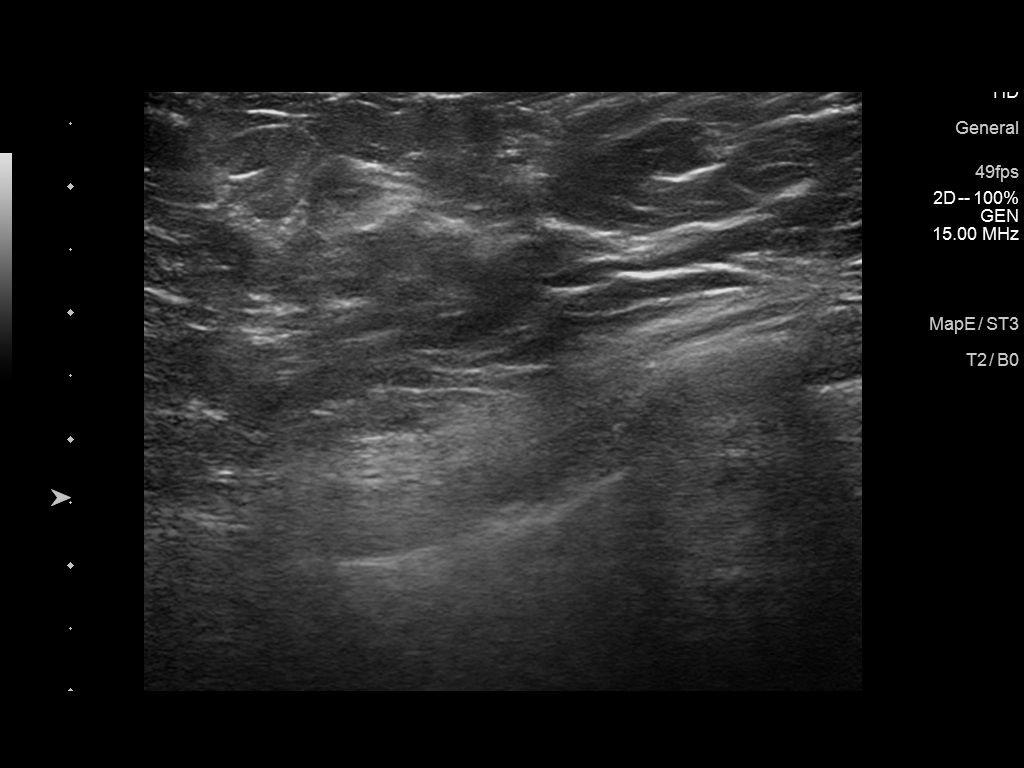
[im 2/3]
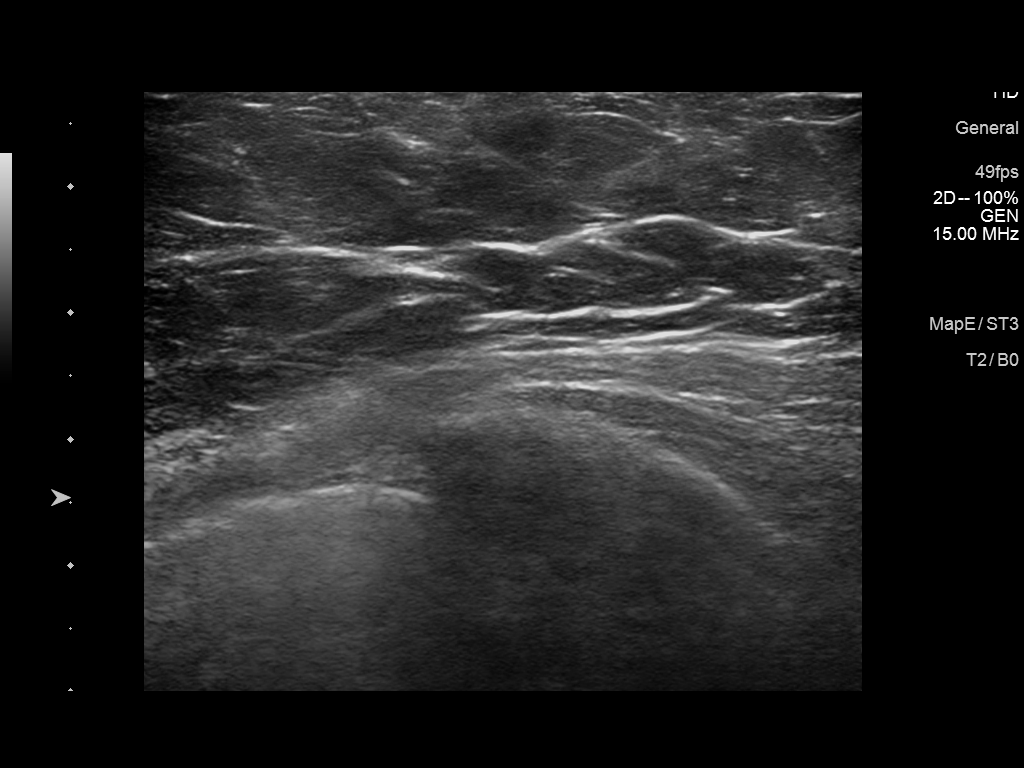
[im 3/3]
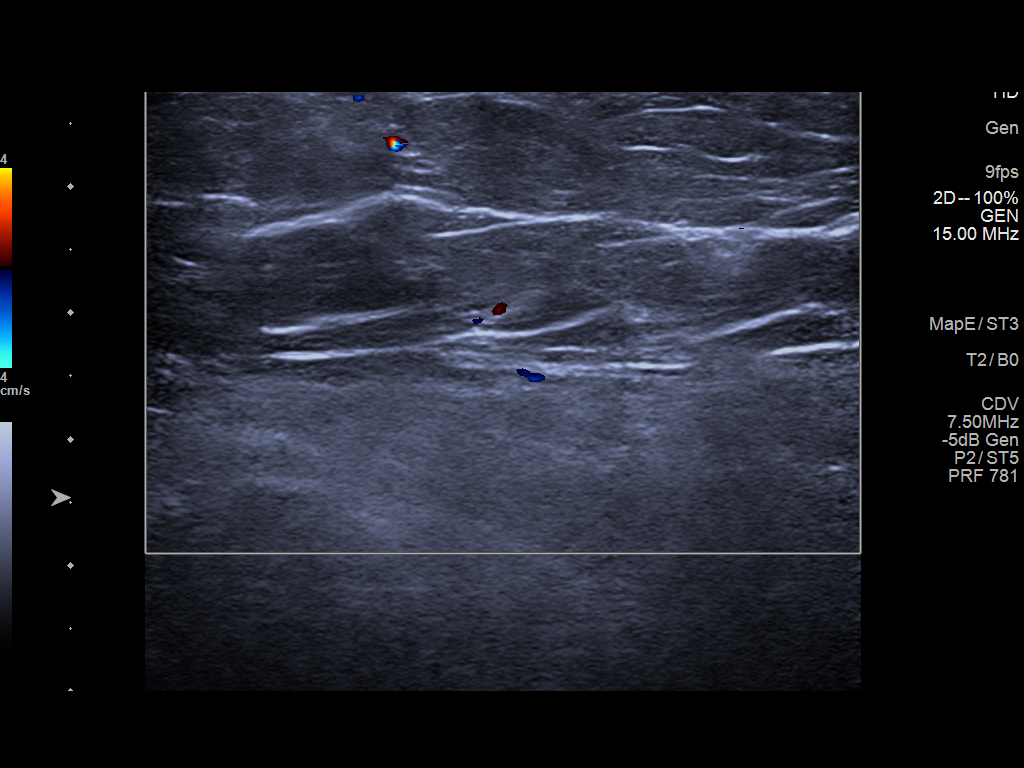

[3 of 3 positions shown; findings below may reference images not displayed]

FINDINGS: Targeted ultrasound is performed, showing no focal or suspicious
sonographic abnormality in the inferior left breast.
IMPRESSION: Unremarkable ultrasound evaluation of the inferior left breast.

RECOMMENDATION:
1. Clinical follow-up recommended for the palpable area of concern
in the left breast. Any further workup should be based on clinical
grounds.
2. Screening mammogram at age 42 unless there are persistent or
intervening clinical concerns. (Code:[id number])

I have discussed the findings and recommendations with the patient.
If applicable, a reminder letter will be sent to the patient
regarding the next appointment.

BI-RADS CATEGORY  1: Negative.
